# Patient Record
Sex: Female | Born: 1970 | Race: Black or African American | Hispanic: No | Marital: Single
Health system: Southern US, Community
[De-identification: ages and names within clinical notes are randomized; demographics above are authoritative.]

---

## 2009-11-22 ENCOUNTER — Emergency Department: Payer: Self-pay | Admitting: Emergency Medicine

## 2010-03-26 ENCOUNTER — Emergency Department: Payer: Self-pay | Admitting: Emergency Medicine

## 2012-07-07 IMAGING — CR DG CHEST 2V
1 series · 2 of 2 positions shown · non-contrast
Comparison: none

REASON FOR EXAM: chest pain
COMMENTS:

PROCEDURE:     DXR - DXR CHEST PA (OR AP) AND LATERAL  - March 27, 2010 [DATE]
RESULT:     Comparison: 11/22/2009

[Series 1: view not recorded · 0.17mm/px · 2 of 2 slices shown]
[im 1/2]
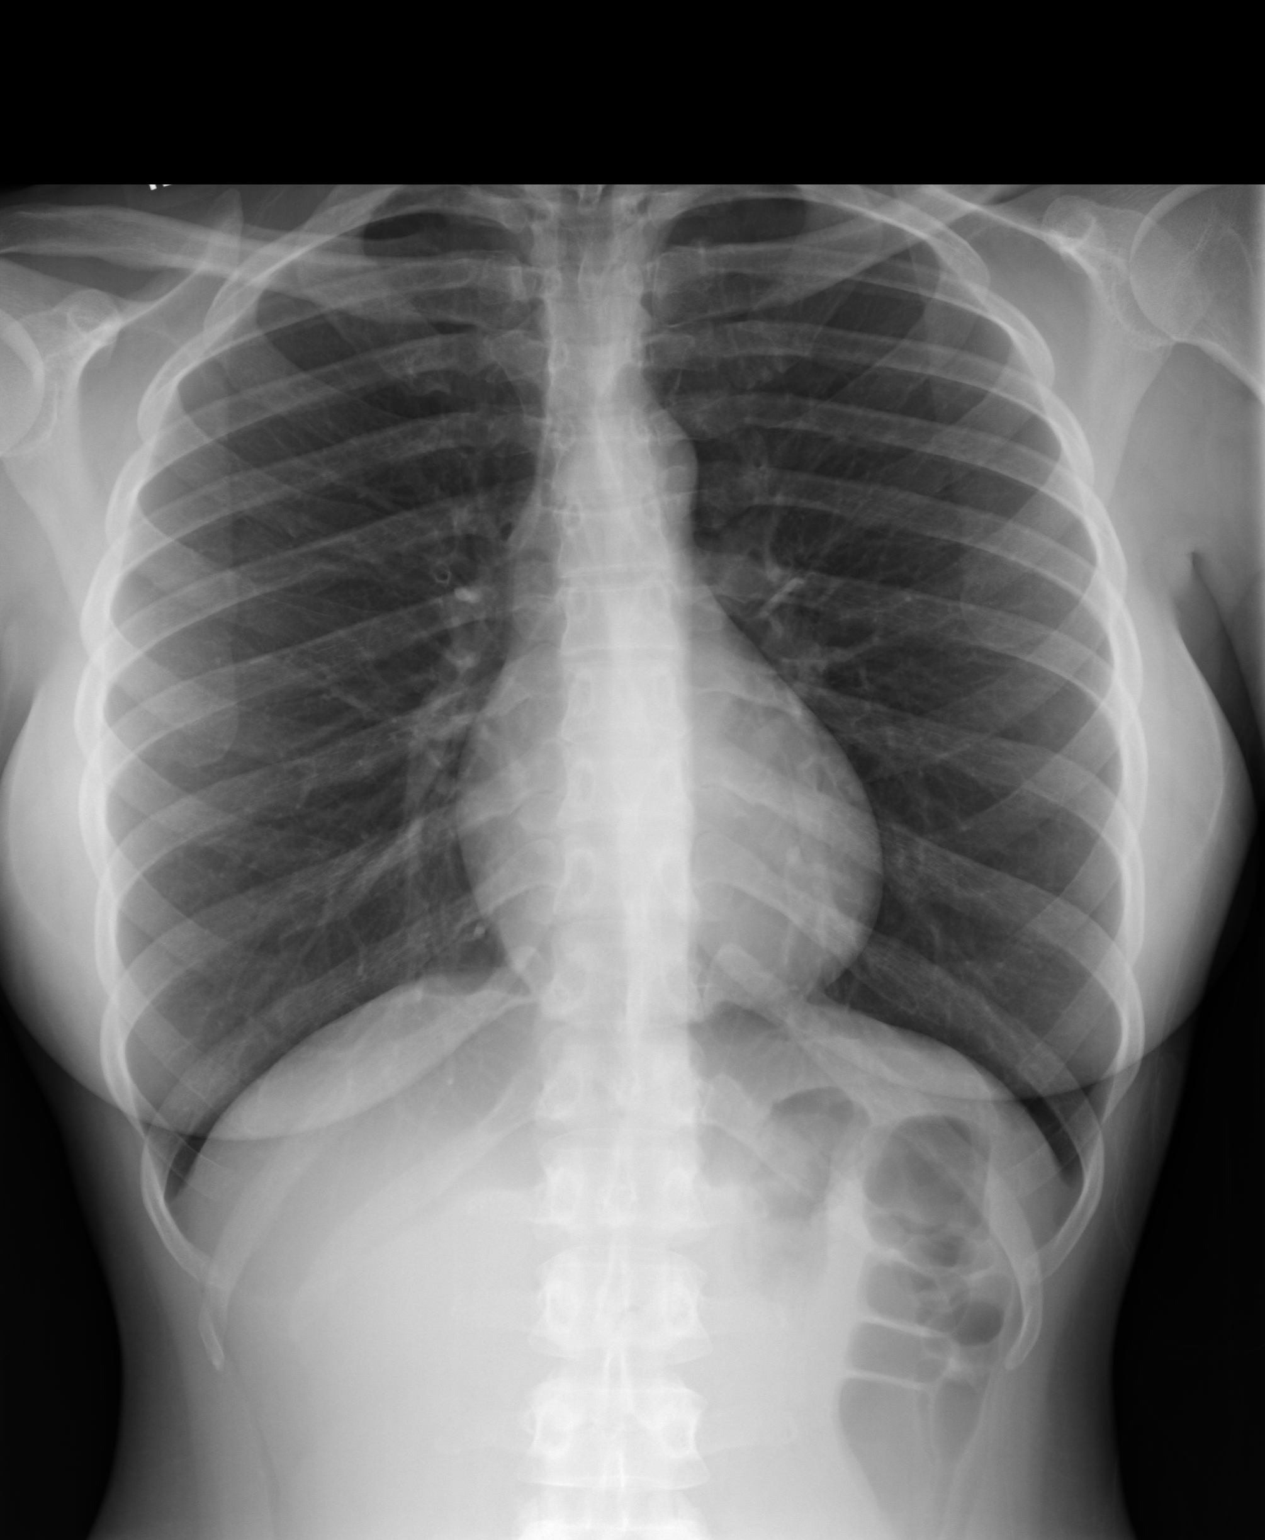
[im 2/2]
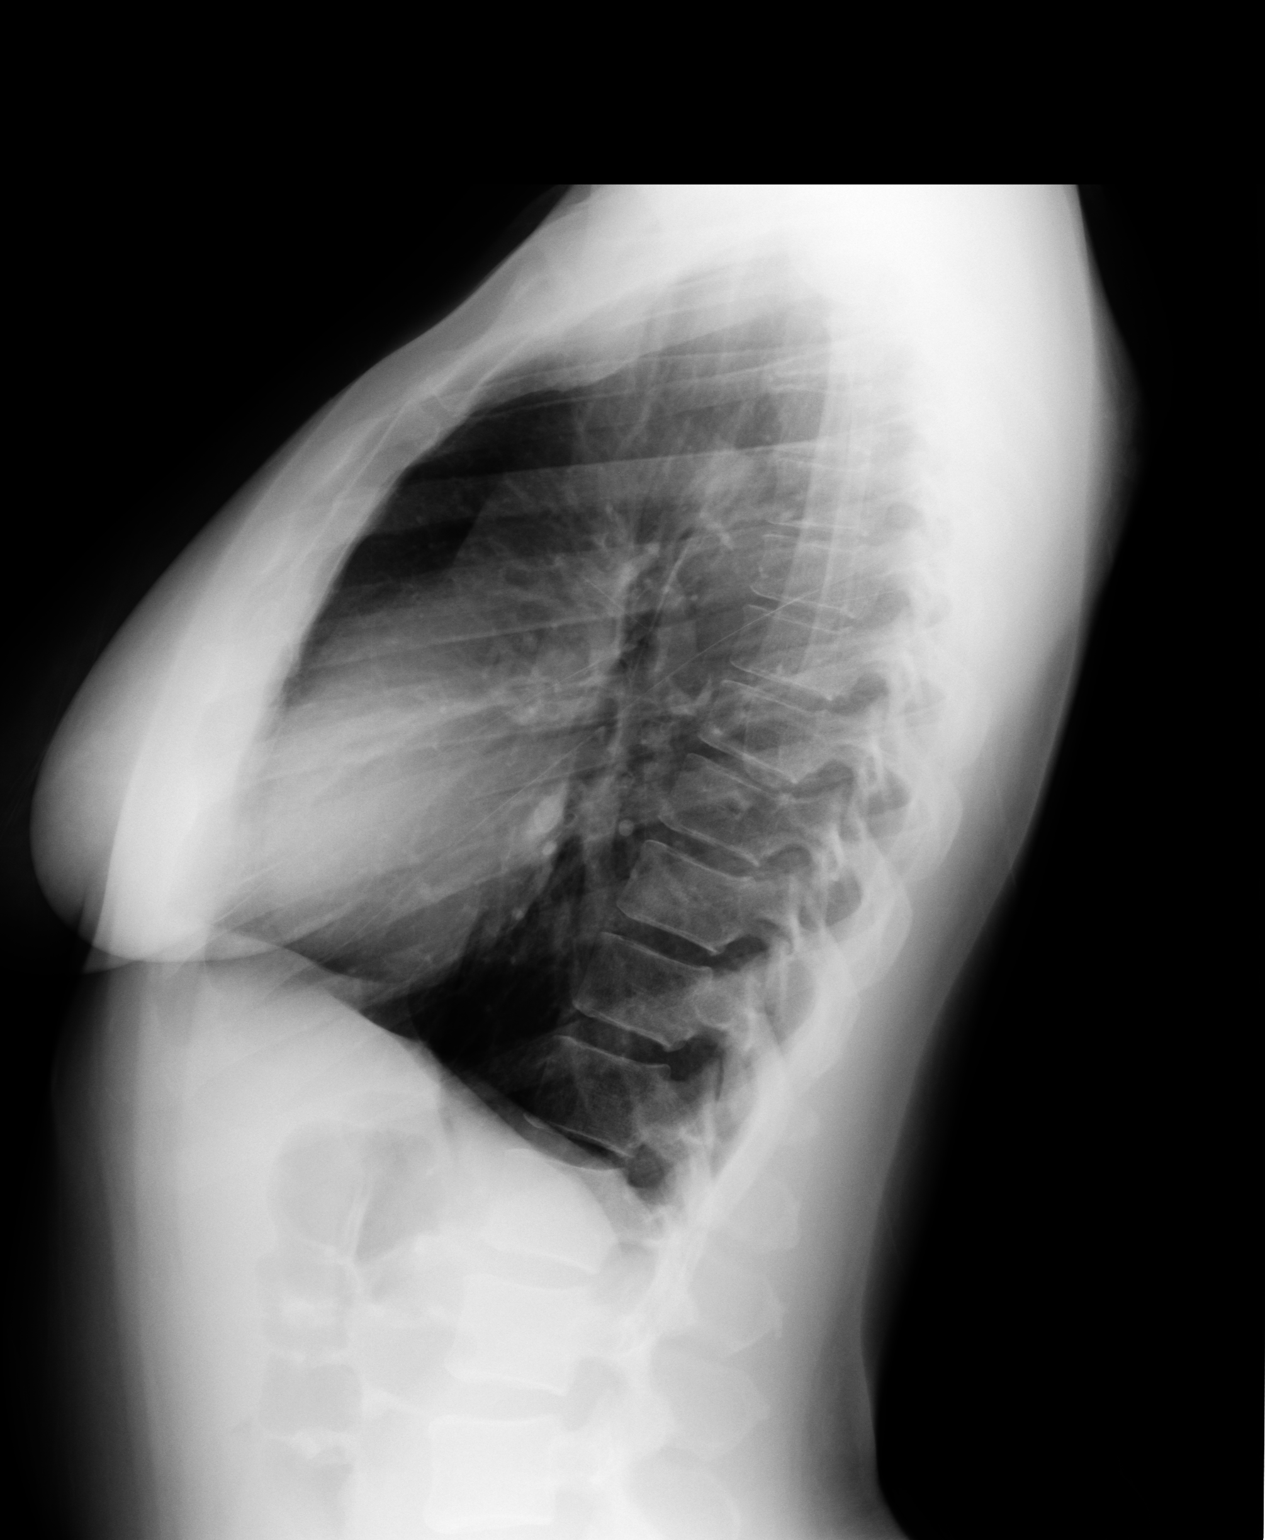

[2 of 2 positions shown; findings below may reference images not displayed]

FINDINGS: The heart and mediastinum are within normal limits. The lungs are clear.
There is hyperinflation of the lungs versus a vigorous inspiratory effort.
IMPRESSION: Hyperinflation. Otherwise, no acute cardiopulmonary disease.

## 2019-04-23 ENCOUNTER — Other Ambulatory Visit: Payer: Self-pay

## 2019-04-23 ENCOUNTER — Ambulatory Visit: Payer: Self-pay | Attending: Internal Medicine

## 2019-04-23 DIAGNOSIS — Z23 Encounter for immunization: Secondary | ICD-10-CM

## 2019-04-23 NOTE — Progress Notes (Signed)
   Covid-[facility] Vaccination [facility]  Name:  Gloria Schroeder    MRN: [id number] DOB: 21-Jul-1970  04/23/2019  [name] was observed post Covid-19 immunization for 15 minutes without incident. She was provided with Vaccine Information Sheet and instruction to access the V-Safe system.   Ms. Straw was instructed to call 911 with any severe reactions post vaccine: Marland Kitchen Difficulty breathing  . Swelling of face and throat  . A fast heartbeat  . A bad rash all over body  . Dizziness and weakness   Immunizations Administered    Name Date Dose VIS Date Route   Pfizer COVID-19 Vaccine 04/23/2019 11:01 AM 0.3 mL 01/12/2019 Intramuscular   Manufacturer: [employer], [employer]   Lot: [id number]   NDC: [address]-3

## 2019-05-16 ENCOUNTER — Ambulatory Visit: Payer: Self-pay | Attending: Internal Medicine

## 2019-05-16 DIAGNOSIS — Z23 Encounter for immunization: Secondary | ICD-10-CM

## 2019-05-16 NOTE — Progress Notes (Signed)
   Covid-19 Vaccination Clinic  Name:  Franchelle Foskett    MRN: [id number] DOB: 11/22/1970  05/16/2019  Ms. Behlke was observed post Covid-19 immunization for 15 minutes without incident. She was provided with Vaccine Information Sheet and instruction to access the V-Safe system.   Ms. Bussie was instructed to call 911 with any severe reactions post vaccine: Marland Kitchen Difficulty breathing  . Swelling of face and throat  . A fast heartbeat  . A bad rash all over body  . Dizziness and weakness   Immunizations Administered    Name Date Dose VIS Date Route   Pfizer COVID-19 Vaccine 05/16/2019 11:45 AM 0.3 mL 01/12/2019 Intramuscular   Manufacturer: [employer], [employer]   Lot: [id number]   NDC: [address]-2
